# Patient Record
Sex: Male | Born: 2000 | Race: Black or African American | Hispanic: No | Marital: Single | Smoking: Never smoker
Health system: Southern US, Community
[De-identification: ages and names within clinical notes are randomized; demographics above are authoritative.]

## PROBLEM LIST (undated history)

## (undated) DIAGNOSIS — K5900 Constipation, unspecified: Secondary | ICD-10-CM

## (undated) DIAGNOSIS — Z9109 Other allergy status, other than to drugs and biological substances: Secondary | ICD-10-CM

---
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
## 2018-08-15 DIAGNOSIS — Z113 Encounter for screening for infections with a predominantly sexual mode of transmission: Secondary | ICD-10-CM | POA: Diagnosis not present

## 2020-02-05 ENCOUNTER — Encounter (HOSPITAL_COMMUNITY): Payer: Self-pay

## 2020-02-05 ENCOUNTER — Other Ambulatory Visit: Payer: Self-pay

## 2020-02-05 ENCOUNTER — Emergency Department (HOSPITAL_COMMUNITY)
Admission: EM | Admit: 2020-02-05 | Discharge: 2020-02-05 | Disposition: A | Discharge status: Home / Self Care | Payer: BC Managed Care – PPO | Attending: Emergency Medicine | Admitting: Emergency Medicine

## 2020-02-05 DIAGNOSIS — Z91018 Allergy to other foods: Secondary | ICD-10-CM | POA: Insufficient documentation

## 2020-02-05 DIAGNOSIS — T782XXA Anaphylactic shock, unspecified, initial encounter: Secondary | ICD-10-CM | POA: Insufficient documentation

## 2020-02-05 DIAGNOSIS — R109 Unspecified abdominal pain: Secondary | ICD-10-CM | POA: Diagnosis present

## 2020-02-05 MED ORDER — FAMOTIDINE 20 MG PO TABS
20.0000 mg | ORAL_TABLET | Freq: Once | ORAL | Status: AC
  Administered 2020-02-05: 20 mg via ORAL
  Filled 2020-02-05: qty 1

## 2020-02-05 MED ORDER — DEXAMETHASONE 4 MG PO TABS
10.0000 mg | ORAL_TABLET | Freq: Once | ORAL | Status: AC
  Administered 2020-02-05: 07:00:00 10 mg via ORAL
  Filled 2020-02-05: qty 3

## 2020-02-05 MED ORDER — METHYLPREDNISOLONE SODIUM SUCC 125 MG IJ SOLR: 125.0000 mg | Freq: Once | INTRAMUSCULAR | Status: DC

## 2020-02-05 MED ORDER — EPINEPHRINE 0.3 MG/0.3ML IJ SOAJ: 0.3000 mg | INTRAMUSCULAR | 0 refills | Status: AC | PRN

## 2020-02-05 MED ORDER — DEXAMETHASONE SODIUM PHOSPHATE 10 MG/ML IJ SOLN
10.0000 mg | Freq: Once | INTRAMUSCULAR | Status: DC
  Filled 2020-02-05: qty 1

## 2020-02-05 MED ORDER — DEXAMETHASONE 4 MG PO TABS
10.0000 mg | ORAL_TABLET | Freq: Once | ORAL | Status: DC
  Filled 2020-02-05: qty 3

## 2020-02-05 MED ORDER — FAMOTIDINE IN NACL 20-0.9 MG/50ML-% IV SOLN: 20.0000 mg | Freq: Once | INTRAVENOUS | Status: DC

## 2020-02-05 NOTE — ED Provider Notes (Signed)
[facility] EMERGENCY DEPARTMENT Provider Note   CSN: [id number] Arrival date & time: 02/05/20  0531     History Chief Complaint  Patient presents with  . Allergic Reaction    Willie Carlson is a 19 y.o. male.  HPI     This is a 19 year old male with a known allergy to tree nuts who presents with anaphylaxis.  Patient reports that he ate part of his mother's vegan burger.  They did not realize that had nuts in it.  He started to have abdominal pain and vomiting.  He also noted his eyes were itchy and he looked in the mirror and noted swelling.  His EpiPen at home was expired.  They called EMS.  He received epinephrine and Benadryl with defervesced since of his symptoms.  He reports that he was itchy all over as well but without rash.  Currently he is symptom-free.  Denies shortness of breath, trouble swallowing  Past Medical History:  Diagnosis Date  . Constipation   . Environmental allergies     Patient Active Problem List   Diagnosis Date Noted  . Allergic rhinitis 06/06/2014  . 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 Acne 10/15/2013    No past surgical history on file.     No family history on file.  Social History   Tobacco Use  . Smoking status: Never Smoker  . Smokeless tobacco: Never Used  Substance Use Topics  . Alcohol use: Not on file  . Drug use: Not on file    Home Medications Prior to Admission medications   Medication Sig Start Date End Date Taking? Authorizing Provider  tretinoin (RETIN-A) 0.025 % cream Apply topically at bedtime. Use small amount on entire affected area every other night for 2 weeks, then every night.  Use moisturizer over medication and as often as needed. 10/15/13   Prose, [place] Bing, MD  tretinoin (RETIN-A) 0.05 % cream Apply topically at bedtime. 12/17/13   Tilman Neat, MD    Allergies    Patient has no known allergies.  Review of Systems   Review of Systems  Constitutional: Negative for fever.  HENT:  Positive for facial swelling. Negative for trouble swallowing.   Respiratory: Negative for shortness of breath.   Cardiovascular: Negative for chest pain.  Gastrointestinal: Positive for nausea and vomiting. Negative for abdominal pain.  Skin: Negative for rash.       Itching  All other systems reviewed and are negative.   Physical Exam Updated Vital Signs BP 122/81 (BP Location: Right Arm)   Pulse 83   Temp 97.7 F (36.5 C) (Oral)   Resp 16   Ht 1.778 m (5\' 10" )   Wt 63.5 kg   SpO2 100%   BMI 20.09 kg/m   Physical Exam Vitals and nursing note reviewed.  Constitutional:      Appearance: He is well-developed.     Comments: ABCs intact, nontoxic-appearing  HENT:     Head: Normocephalic and atraumatic.     Mouth/Throat:     Mouth: Mucous membranes are moist.     Comments: No oropharyngeal edema noted Eyes:     Pupils: Pupils are equal, round, and reactive to light.  Cardiovascular:     Rate and Rhythm: Normal rate and regular rhythm.     Heart sounds: Normal heart sounds. No murmur.  Pulmonary:     Effort: Pulmonary effort is normal. No respiratory distress.     Breath sounds: Normal breath sounds. No wheezing.  Abdominal:     General: Bowel sounds are normal.     Palpations: Abdomen is soft.     Tenderness: There is no abdominal tenderness. There is no rebound.  Musculoskeletal:     Cervical back: Neck supple.     Right lower leg: No edema.     Left lower leg: No edema.  Lymphadenopathy:     Cervical: No cervical adenopathy.  Skin:    General: Skin is warm and dry.  Neurological:     Mental Status: He is alert and oriented to person, place, and time.  Psychiatric:        Mood and Affect: Mood normal.     ED Results / Procedures / Treatments   Labs (all labs ordered are listed, but only abnormal results are displayed) Labs Reviewed - No data to display  EKG None  Radiology No results found.  Procedures Procedures (including critical care  time)  CRITICAL CARE Performed by: Merryl Hacker   Total critical care time: 31 minutes  Critical care time was exclusive of separately billable procedures and treating other patients.  Critical care was necessary to treat or prevent imminent or life-threatening deterioration.  Critical care was time spent personally by me on the following activities: development of treatment plan with patient and/or surrogate as well as nursing, discussions with consultants, evaluation of patient's response to treatment, examination of patient, obtaining history from patient or surrogate, ordering and performing treatments and interventions, ordering and review of laboratory studies, ordering and review of radiographic studies, pulse oximetry and re-evaluation of patient's condition.   Medications Ordered in ED Medications  methylPREDNISolone sodium succinate (SOLU-MEDROL) 125 mg/2 mL injection 125 mg (has no administration in time range)  famotidine (PEPCID) IVPB 20 mg premix (has no administration in time range)    ED Course  I have reviewed the triage vital signs and the nursing notes.  Pertinent labs & imaging results that were available during my care of the patient were reviewed by me and considered in my medical decision making (see chart for details).    MDM Rules/Calculators/A&P                       Patient presents following anaphylaxis.  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  Patient was given epinephrine and Benadryl in route with defervescence of his symptoms.  He was subsequently given IV Solu-Medrol and Pepcid here.  He will need to be observed for 4 hours post epinephrine for recurrence of symptoms.  He will be discharged home with a course of steroids, epinephrine, Benadryl as needed.  Final Clinical Impression(s) / ED Diagnoses Final diagnoses:  Anaphylaxis, initial encounter    Rx / DC Orders ED Discharge Orders    None       Merryl Hacker, MD 02/05/20  [phone]

## 2020-02-05 NOTE — ED Triage Notes (Signed)
Patient arrived from home [facility]. Patient ate a burger last night and had an allergic reaction to nuts. Didn't use at home epi. C/O abd pain, vommitted, hives face swollen, itchy. EMS gave 0.5 epi & 50 IM benadryl. VS 126/84, 74 HR, 99% RA, RR 16.

## 2020-02-05 NOTE — ED Provider Notes (Signed)
Care transferred to me. Patient has remained well and asymptomatic. D/c with Rx for epipen. Already received decadron.   Pricilla Loveless, MD 02/05/20 [phone]

## 2021-11-03 ENCOUNTER — Other Ambulatory Visit: Payer: Self-pay

## 2021-11-03 ENCOUNTER — Ambulatory Visit: Payer: Self-pay | Admitting: Internal Medicine

## 2021-11-03 ENCOUNTER — Ambulatory Visit (INDEPENDENT_AMBULATORY_CARE_PROVIDER_SITE_OTHER): Payer: 59 | Admitting: Internal Medicine

## 2021-11-03 ENCOUNTER — Encounter: Payer: Self-pay | Admitting: Internal Medicine

## 2021-11-03 VITALS — BP 114/60 | HR 61 | Temp 97.7°F | Ht 68.6 in | Wt 141.4 lb

## 2021-11-03 DIAGNOSIS — Z0000 Encounter for general adult medical examination without abnormal findings: Secondary | ICD-10-CM | POA: Diagnosis not present

## 2021-11-03 DIAGNOSIS — B356 Tinea cruris: Secondary | ICD-10-CM

## 2021-11-03 NOTE — Patient Instructions (Addendum)
Jock Itch Jock itch is an itchy rash in the groin and upper thigh area. It is a skin infection that is caused by a type of germ (fungus). The rash usually goes away in 2-3 weeks with treatment. What are the causes? This condition may be caused by: Touching a fungal infection elsewhere on your body, such as athlete's foot, and then touching your groin area. Sharing towels or clothing, such as socks or shoes, with someone who has a fungal infection. What increases the risk? This condition is most common in men and adolescent boys. You are also more likely to develop the condition if you: Are in a hot, humid climate. Wear tight-fitting clothes or wet bathing suits for a long time. Play sports. Are overweight. Have diabetes. Have a weakened body defense system (immune system). Sweat a lot. What are the signs or symptoms? Symptoms of this condition include: A red, pink, or brown rash in the groin area. There may be blisters. The rash may spread to your thighs, the opening between the butt (anus), and the butt. Dry and scaly skin on or around the rash. Itchiness. How is this treated? Treatment for this condition may include: Medicine to kill the fungus (antifungal). This may be one of the following: Skin cream. Ointment. Powder. Medicine that you take by mouth. Skin cream or ointment to reduce itching. Lifestyle changes, such as wearing looser clothing and caring for your skin. Follow these instructions at home: Skin care Use skin creams, ointments, or powders exactly as told by your doctor. Wear clothes that are loose. Clothes should not rub against your groin area. Men should wear boxer shorts or loose underwear. Keep your groin area clean and dry. Change your underwear every day. Change out of wet bathing suits as soon as you can. After bathing, use a different towel to dry your groin area. Dry the area gently and completely. Avoid hot baths and showers. Hot water can make itching  worse. Do not scratch the area. General instructions Take and apply over-the-counter and prescription medicines only as told by your doctor. Do not share towels or clothing with other people. Wash your hands often with soap and water for at least 20 seconds, especially after touching your groin area. If you cannot use soap and water, use hand sanitizer. When at the gym: Always wear shoes, especially in the shower and around the swimming pool. Keep any cuts covered. Clean any mats or equipment before using them. Shower as soon as you are done working out. Keep all follow-up visits. Contact a doctor if: Your rash: Gets worse. Does not get better after 2 weeks of treatment. Spreads. Comes back after treatment is done. You have a fever. You have new or more redness, swelling, or pain around your rash. You have fluid, blood, or pus coming from your rash. Summary Jock itch is an itchy rash. It affects the groin and upper thigh area. Jock itch usually goes away in 2-3 weeks with treatment. Keep your groin area clean and dry. This information is not intended to replace advice given to you by your health care provider. Make sure you discuss any questions you have with your health care provider. Document Revised: 02/10/2021 Document Reviewed: 02/10/2021 Elsevier Patient Education  2022 Else[employer] Inc.   Health Maintenance, Male Adopting a healthy lifestyle and getting preventive care are important in promoting health and wellness. Ask your health care provider about: The right schedule for you to have regular tests and exams. Things you can  do on your own to prevent diseases and keep yourself healthy. What should I know about diet, weight, and exercise? Eat a healthy diet  Eat a diet that includes plenty of vegetables, fruits, low-fat dairy products, and lean protein. Do not eat a lot of foods that are high in solid fats, added sugars, or sodium. Maintain a healthy weight Body mass index  (BMI) is a measurement that can be used to identify possible weight problems. It estimates body fat based on height and weight. Your health care provider can help determine your BMI and help you achieve or maintain a healthy weight. Get regular exercise Get regular exercise. This is one of the most important things you can do for your health. Most adults should: Exercise for at least 150 minutes each week. The exercise should increase your heart rate and make you sweat (moderate-intensity exercise). Do strengthening exercises at least twice a week. This is in addition to the moderate-intensity exercise. Spend less time sitting. Even light physical activity can be beneficial. Watch cholesterol and blood lipids Have your blood tested for lipids and cholesterol at 22 years of age, then have this test every 5 years. You may need to have your cholesterol levels checked more often if: Your lipid or cholesterol levels are high. You are older than 22 years of age. You are at high risk for heart disease. What should I know about cancer screening? Many types of cancers can be detected early and may often be prevented. Depending on your health history and family history, you may need to have cancer screening at various ages. This may include screening for: Colorectal cancer. Prostate cancer. Skin cancer. Lung cancer. What should I know about heart disease, diabetes, and high blood pressure? Blood pressure and heart disease High blood pressure causes heart disease and increases the risk of stroke. This is more likely to develop in people who have high blood pressure readings or are overweight. Talk with your health care provider about your target blood pressure readings. Have your blood pressure checked: Every 3-5 years if you are 51-73 years of age. Every year if you are 77 years old or older. If you are between the ages of 52 and 12 and are a current or former smoker, ask your health care provider if  you should have a one-time screening for abdominal aortic aneurysm (AAA). Diabetes Have regular diabetes screenings. This checks your fasting blood sugar level. Have the screening done: Once every three years after age 27 if you are at a normal weight and have a low risk for diabetes. More often and at a younger age if you are overweight or have a high risk for diabetes. What should I know about preventing infection? Hepatitis B If you have a higher risk for hepatitis B, you should be screened for this virus. Talk with your health care provider to find out if you are at risk for hepatitis B infection. Hepatitis C Blood testing is recommended for: Everyone born from [birth year] through 1965. Anyone with known risk factors for hepatitis C. Sexually transmitted infections (STIs) You should be screened each year for STIs, including gonorrhea and chlamydia, if: You are sexually active and are younger than 22 years of age. You are older than 22 years of age and your health care provider tells you that you are at risk for this type of infection. Your sexual activity has changed since you were last screened, and you are at increased risk for chlamydia or gonorrhea. Ask your  health care provider if you are at risk. Ask your health care provider about whether you are at high risk for HIV. Your health care provider may recommend a prescription medicine to help prevent HIV infection. If you choose to take medicine to prevent HIV, you should first get tested for HIV. You should then be tested every 3 months for as long as you are taking the medicine. Follow these instructions at home: Alcohol use Do not drink alcohol if your health care provider tells you not to drink. If you drink alcohol: Limit how much you have to 0-2 drinks a day. Know how much alcohol is in your drink. In the [place] one drink equals one 12 oz bottle of beer (355 mL), one 5 oz glass of wine (148 mL), or one 1 oz glass of hard liquor (44  mL). Lifestyle Do not use any products that contain nicotine or tobacco. These products include cigarettes, chewing tobacco, and vaping devices, such as e-cigarettes. If you need help quitting, ask your health care provider. Do not use street drugs. Do not share needles. Ask your health care provider for help if you need support or information about quitting drugs. General instructions Schedule regular health, dental, and eye exams. Stay current with your vaccines. Tell your health care provider if: You often feel depressed. You have ever been abused or do not feel safe at home. Summary Adopting a healthy lifestyle and getting preventive care are important in promoting health and wellness. Follow your health care provider's instructions about healthy diet, exercising, and getting tested or screened for diseases. Follow your health care provider's instructions on monitoring your cholesterol and blood pressure. This information is not intended to replace advice given to you by your health care provider. Make sure you discuss any questions you have with your health care provider. Document Revised: 04/13/2021 Document Reviewed: 04/13/2021 Elsevier Patient Education  2022 [employer].

## 2021-11-03 NOTE — Progress Notes (Signed)
I,Willie Carlson,acting as a Neurosurgeon for Willie Aliment, MD.,have documented all relevant documentation on the behalf of Willie Aliment, MD,as directed by  Willie Aliment, MD while in the presence of Willie Aliment, MD.  This visit occurred during the SARS-CoV-2 public health emergency.  Safety protocols were in place, including screening questions prior to the visit, additional usage of staff PPE, and extensive cleaning of exam room while observing appropriate contact time as indicated for disinfecting solutions.  Subjective:     Patient ID: Willie Carlson , male    DOB: 2001/05/24 , 20 y.o.   MRN: [id number]   Chief Complaint  Patient presents with   Establish Care    HPI  Patient is here to establish care. He is a Development worker, international aid at [employer]. He presents today to establish primary care.  He is a single, no children. He has a Oncologist in [employer] from [place]. He would like to have a physical exam today.   He is against all vaccines.     Past Medical History:  Diagnosis Date   Constipation    Environmental allergies      Family History  Problem Relation Age of Onset   Healthy Mother    Healthy Father    Healthy Sister    Healthy Brother    Arthritis Maternal Grandfather      Current Outpatient Medications:    EPINEPHrine (EPIPEN 2-PAK) 0.3 mg/0.3 mL IJ SOAJ injection, Inject 0.3 mLs (0.3 mg total) into the muscle as needed for anaphylaxis., Disp: 1 each, Rfl: 0   Allergies  Allergen Reactions   Other Nausea And Vomiting and Swelling    Tree Nuts      Review of Systems  Constitutional: Negative.   HENT: Negative.    Eyes: Negative.   Respiratory: Negative.    Cardiovascular: Negative.   Endocrine: Negative.   Genitourinary: Negative.   Musculoskeletal: Negative.   Skin:  Positive for rash.        He would like to discuss some skin issues. He states that he had a rash on his groin area but it has healed. He states he  first noticed it about a month ago.  He states he was treated at [facility]. States bloodwork was negative for STI. He was prescribed a "cream" that helped to clear it up.   He also had bites on his legs. He had his dorm room checked for bed bugs. The bites are healing.  Allergic/Immunologic: Negative.   Neurological: Negative.   Hematological: Negative.   Psychiatric/Behavioral: Negative.      Today's Vitals   11/03/21 0929  BP: 114/60  Pulse: 61  Temp: 97.7 F (36.5 C)  TempSrc: Oral  Weight: 141 lb 6.4 oz (64.1 kg)  Height: 5' 8.6" (1.742 m)   Body mass index is 21.13 kg/m.  Wt Readings from Last 3 Encounters:  11/03/21 141 lb 6.4 oz (64.1 kg)  02/05/20 140 lb (63.5 kg)  12/03/16 151 lb 10.8 oz (68.8 kg) (58 %, Z= 0.20)*   * Growth percentiles are based on CDC (Boys, 2-20 Years) data.    Objective:  Physical Exam Vitals and nursing note reviewed. Exam conducted with a chaperone present.  Constitutional:      Appearance: Normal appearance.  HENT:     Head: Normocephalic and atraumatic.     Right Ear: Tympanic membrane, ear canal and external ear normal.     Left Ear: Tympanic membrane, ear  canal and external ear normal.     Nose:     Comments: Masked     Mouth/Throat:     Comments: Masked  Eyes:     Extraocular Movements: Extraocular movements intact.     Conjunctiva/sclera: Conjunctivae normal.     Pupils: Pupils are equal, round, and reactive to light.  Cardiovascular:     Rate and Rhythm: Normal rate and regular rhythm.     Pulses: Normal pulses.     Heart sounds: Normal heart sounds.  Pulmonary:     Effort: Pulmonary effort is normal.     Breath sounds: Normal breath sounds.  Chest:  Breasts:    Right: Normal. No swelling, bleeding, inverted nipple, mass or nipple discharge.     Left: Normal. No swelling, bleeding, inverted nipple, mass or nipple discharge.  Abdominal:     General: Abdomen is flat. Bowel sounds are normal.     Palpations:  Abdomen is soft.  Genitourinary:    Pubic Area: No rash.      Penis: Circumcised.      Testes: Normal.     Epididymis:     Right: Normal.     Left: Normal.     Prostate: Normal.     Rectum: Normal. Guaiac result negative.     Comments: He is sweating profusely in genital area Musculoskeletal:        General: Normal range of motion.     Cervical back: Normal range of motion and neck supple.  Skin:    General: Skin is warm.  Neurological:     General: No focal deficit present.     Mental Status: He is alert.  Psychiatric:        Mood and Affect: Mood normal.        Behavior: Behavior normal.        Assessment And Plan:     1. Routine general medical examination at health care facility Comments: A full exam was performed. He does not wish to have any vaccinations. PATIENT IS ADVISED TO GET 30-45 MINUTES REGULAR EXERCISE NO LESS THAN FOUR TO FIVE DAYS PER WEEK - BOTH WEIGHTBEARING EXERCISES AND AEROBIC ARE RECOMMENDED.  PATIENT IS ADVISED TO FOLLOW A HEALTHY DIET WITH AT LEAST SIX FRUITS/VEGGIES PER DAY, DECREASE INTAKE OF RED MEAT, AND TO INCREASE FISH INTAKE TO TWO DAYS PER WEEK.  MEATS/FISH SHOULD NOT BE FRIED, BAKED OR BROILED IS PREFERABLE.  IT IS ALSO IMPORTANT TO CUT BACK ON YOUR SUGAR INTAKE. PLEASE AVOID ANYTHING WITH ADDED SUGAR, CORN SYRUP OR OTHER SWEETENERS. IF YOU MUST USE A SWEETENER, YOU CAN TRY STEVIA. IT IS ALSO IMPORTANT TO AVOID ARTIFICIALLY SWEETENERS AND DIET BEVERAGES. LASTLY, I SUGGEST WEARING SPF 50 SUNSCREEN ON EXPOSED PARTS AND ESPECIALLY WHEN IN THE DIRECT SUNLIGHT FOR AN EXTENDED PERIOD OF TIME.  PLEASE AVOID FAST FOOD RESTAURANTS AND INCREASE YOUR WATER INTAKE.  2. Tinea cruris Comments: Resolving. I will request his records from [facility]. He is encouraged to keep genital area clean/dry. Advised to try GoldBond powder daily.    Patient was given opportunity to ask questions. Patient verbalized understanding of the plan and was able to repeat key  elements of the plan. All questions were answered to their satisfaction.   I, Willie Aliment, MD, have reviewed all documentation for this visit. The documentation on 11/03/21 for the exam, diagnosis, procedures, and orders are all accurate and complete.   IF YOU HAVE BEEN REFERRED TO A SPECIALIST, IT MAY TAKE 1-2  WEEKS TO SCHEDULE/PROCESS THE REFERRAL. IF YOU HAVE NOT HEARD FROM US/SPECIALIST IN TWO WEEKS, PLEASE GIVE [place] A CALL AT [phone].   THE PATIENT IS ENCOURAGED TO PRACTICE SOCIAL DISTANCING DUE TO THE COVID-19 PANDEMIC.

## 2021-11-04 LAB — CBC
Hematocrit: 45 % (ref 37.5–51.0)
Hemoglobin: 14.8 g/dL (ref 13.0–17.7)
MCH: 27.1 pg (ref 26.6–33.0)
MCHC: 32.9 g/dL (ref 31.5–35.7)
MCV: 82 fL (ref 79–97)
Platelets: 173 10*3/uL (ref 150–450)
RBC: 5.47 x10E6/uL (ref 4.14–5.80)
RDW: 12.8 % (ref 11.6–15.4)
WBC: 2.9 10*3/uL — ABNORMAL LOW (ref 3.4–10.8)

## 2021-11-04 LAB — CMP14+EGFR
ALT: 19 IU/L (ref 0–44)
AST: 29 IU/L (ref 0–40)
Albumin/Globulin Ratio: 1.9 (ref 1.2–2.2)
Albumin: 5.2 g/dL (ref 4.1–5.2)
Alkaline Phosphatase: 82 IU/L (ref 44–121)
BUN/Creatinine Ratio: 12 (ref 9–20)
BUN: 11 mg/dL (ref 6–20)
Bilirubin Total: 1.4 mg/dL — ABNORMAL HIGH (ref 0.0–1.2)
CO2: 24 mmol/L (ref 20–29)
Calcium: 9.8 mg/dL (ref 8.7–10.2)
Chloride: 109 mmol/L — ABNORMAL HIGH (ref 96–106)
Creatinine, Ser: 0.89 mg/dL (ref 0.76–1.27)
Globulin, Total: 2.7 g/dL (ref 1.5–4.5)
Glucose: 83 mg/dL (ref 70–99)
Potassium: 4.6 mmol/L (ref 3.5–5.2)
Sodium: 147 mmol/L — ABNORMAL HIGH (ref 134–144)
Total Protein: 7.9 g/dL (ref 6.0–8.5)
eGFR: 124 mL/min/{1.73_m2} (ref >59)

## 2021-11-04 LAB — LIPID PANEL
Chol/HDL Ratio: 2.8 ratio (ref 0.0–5.0)
Cholesterol, Total: 141 mg/dL (ref 100–199)
HDL: 50 mg/dL (ref >39)
LDL Chol Calc (NIH): 79 mg/dL (ref 0–99)
Triglycerides: 59 mg/dL (ref 0–149)
VLDL Cholesterol Cal: 12 mg/dL (ref 5–40)

## 2021-11-04 LAB — HEPATITIS C ANTIBODY: Hep C Virus Ab: <0.1 s/co ratio (ref 0.0–0.9)

## 2021-11-12 ENCOUNTER — Ambulatory Visit: Payer: Self-pay | Admitting: Nurse Practitioner

## 2022-02-10 ENCOUNTER — Other Ambulatory Visit: Payer: Self-pay

## 2022-02-10 ENCOUNTER — Other Ambulatory Visit: Payer: BC Managed Care – PPO

## 2022-02-10 DIAGNOSIS — D72819 Decreased white blood cell count, unspecified: Secondary | ICD-10-CM

## 2022-02-11 LAB — CBC
Hematocrit: 46.8 % (ref 37.5–51.0)
Hemoglobin: 14.8 g/dL (ref 13.0–17.7)
MCH: 26.8 pg (ref 26.6–33.0)
MCHC: 31.6 g/dL (ref 31.5–35.7)
MCV: 85 fL (ref 79–97)
Platelets: 180 10*3/uL (ref 150–450)
RBC: 5.53 x10E6/uL (ref 4.14–5.80)
RDW: 13.5 % (ref 11.6–15.4)
WBC: 2.7 10*3/uL — ABNORMAL LOW (ref 3.4–10.8)

## 2022-11-24 ENCOUNTER — Ambulatory Visit (INDEPENDENT_AMBULATORY_CARE_PROVIDER_SITE_OTHER): Payer: Managed Care, Other (non HMO) | Admitting: Internal Medicine

## 2022-11-24 ENCOUNTER — Encounter: Payer: Self-pay | Admitting: Internal Medicine

## 2022-11-24 VITALS — BP 112/80 | HR 63 | Temp 98.1°F | Ht 70.0 in | Wt 140.8 lb

## 2022-11-24 DIAGNOSIS — Z6820 Body mass index (BMI) 20.0-20.9, adult: Secondary | ICD-10-CM | POA: Diagnosis not present

## 2022-11-24 DIAGNOSIS — Z5320 Procedure and treatment not carried out because of patient's decision for unspecified reasons: Secondary | ICD-10-CM | POA: Diagnosis not present

## 2022-11-24 DIAGNOSIS — Z0000 Encounter for general adult medical examination without abnormal findings: Secondary | ICD-10-CM | POA: Diagnosis not present

## 2022-11-24 DIAGNOSIS — Z2821 Immunization not carried out because of patient refusal: Secondary | ICD-10-CM

## 2022-11-24 NOTE — Patient Instructions (Signed)
Health Maintenance, Male Adopting a healthy lifestyle and getting preventive care are important in promoting health and wellness. Ask your health care provider about: The right schedule for you to have regular tests and exams. Things you can do on your own to prevent diseases and keep yourself healthy. What should I know about diet, weight, and exercise? Eat a healthy diet  Eat a diet that includes plenty of vegetables, fruits, low-fat dairy products, and lean protein. Do not eat a lot of foods that are high in solid fats, added sugars, or sodium. Maintain a healthy weight Body mass index (BMI) is a measurement that can be used to identify possible weight problems. It estimates body fat based on height and weight. Your health care provider can help determine your BMI and help you achieve or maintain a healthy weight. Get regular exercise Get regular exercise. This is one of the most important things you can do for your health. Most adults should: Exercise for at least 150 minutes each week. The exercise should increase your heart rate and make you sweat (moderate-intensity exercise). Do strengthening exercises at least twice a week. This is in addition to the moderate-intensity exercise. Spend less time sitting. Even light physical activity can be beneficial. Watch cholesterol and blood lipids Have your blood tested for lipids and cholesterol at 23 years of age, then have this test every 5 years. You may need to have your cholesterol levels checked more often if: Your lipid or cholesterol levels are high. You are older than 23 years of age. You are at high risk for heart disease. What should I know about cancer screening? Many types of cancers can be detected early and may often be prevented. Depending on your health history and family history, you may need to have cancer screening at various ages. This may include screening for: Colorectal cancer. Prostate cancer. Skin cancer. Lung  cancer. What should I know about heart disease, diabetes, and high blood pressure? Blood pressure and heart disease High blood pressure causes heart disease and increases the risk of stroke. This is more likely to develop in people who have high blood pressure readings or are overweight. Talk with your health care provider about your target blood pressure readings. Have your blood pressure checked: Every 3-5 years if you are 18-39 years of age. Every year if you are 40 years old or older. If you are between the ages of 65 and 75 and are a current or former smoker, ask your health care provider if you should have a one-time screening for abdominal aortic aneurysm (AAA). Diabetes Have regular diabetes screenings. This checks your fasting blood sugar level. Have the screening done: Once every three years after age 45 if you are at a normal weight and have a low risk for diabetes. More often and at a younger age if you are overweight or have a high risk for diabetes. What should I know about preventing infection? Hepatitis B If you have a higher risk for hepatitis B, you should be screened for this virus. Talk with your health care provider to find out if you are at risk for hepatitis B infection. Hepatitis C Blood testing is recommended for: Everyone born from 1945 through 1965. Anyone with known risk factors for hepatitis C. Sexually transmitted infections (STIs) You should be screened each year for STIs, including gonorrhea and chlamydia, if: You are sexually active and are younger than 24 years of age. You are older than 24 years of age and your   health care provider tells you that you are at risk for this type of infection. Your sexual activity has changed since you were last screened, and you are at increased risk for chlamydia or gonorrhea. Ask your health care provider if you are at risk. Ask your health care provider about whether you are at high risk for HIV. Your health care provider  may recommend a prescription medicine to help prevent HIV infection. If you choose to take medicine to prevent HIV, you should first get tested for HIV. You should then be tested every 3 months for as long as you are taking the medicine. Follow these instructions at home: Alcohol use Do not drink alcohol if your health care provider tells you not to drink. If you drink alcohol: Limit how much you have to 0-2 drinks a day. Know how much alcohol is in your drink. In the [place] one drink equals one 12 oz bottle of beer (355 mL), one 5 oz glass of wine (148 mL), or one 1 oz glass of hard liquor (44 mL). Lifestyle Do not use any products that contain nicotine or tobacco. These products include cigarettes, chewing tobacco, and vaping devices, such as e-cigarettes. If you need help quitting, ask your health care provider. Do not use street drugs. Do not share needles. Ask your health care provider for help if you need support or information about quitting drugs. General instructions Schedule regular health, dental, and eye exams. Stay current with your vaccines. Tell your health care provider if: You often feel depressed. You have ever been abused or do not feel safe at home. Summary Adopting a healthy lifestyle and getting preventive care are important in promoting health and wellness. Follow your health care provider's instructions about healthy diet, exercising, and getting tested or screened for diseases. Follow your health care provider's instructions on monitoring your cholesterol and blood pressure. This information is not intended to replace advice given to you by your health care provider. Make sure you discuss any questions you have with your health care provider. Document Revised: 04/13/2021 Document Reviewed: 04/13/2021 Elsevier Patient Education  2023 Elsevier Inc.  

## 2022-11-24 NOTE — Progress Notes (Signed)
Rich Brave Llittleton,acting as a Education administrator for Maximino Greenland, MD.,have documented all relevant documentation on the behalf of Maximino Greenland, MD,as directed by  Maximino Greenland, MD while in the presence of Maximino Greenland, MD.   Subjective:     Patient ID: Willie Carlson , male    DOB: 01-18-2001 , 21 y.o.   MRN: [id number]   Chief Complaint  Patient presents with   Annual Exam    HPI  Patient presents today for a physical. He denies having headaches, chest pain and shortness of breath. He states he exercises 3-4 days per week. He is not currently sexually active.      Past Medical History:  Diagnosis Date   Constipation    Environmental allergies      Family History  Problem Relation Age of Onset   Healthy Mother    Healthy Father    Healthy Sister    Healthy Brother    Arthritis Maternal Grandfather      Current Outpatient Medications:    EPINEPHrine (EPIPEN 2-PAK) 0.3 mg/0.3 mL IJ SOAJ injection, Inject 0.3 mLs (0.3 mg total) into the muscle as needed for anaphylaxis., Disp: 1 each, Rfl: 0   Allergies  Allergen Reactions   Dust Mite Extract    Molds & Smuts    Other Nausea And Vomiting and Swelling    Tree Nuts      Men's preventive visit. Patient Health Questionnaire (PHQ-2) is  [place] Office Visit from 11/24/2022 in [facility]  PHQ-2 Total Score 0     . Patient is on a healthy diet. Marital status: Single. Relevant history for alcohol use is:  Social History   Substance and Sexual Activity  Alcohol Use Not Currently  . Relevant history for tobacco use is:  Social History   Tobacco Use  Smoking Status Never  Smokeless Tobacco Never  .   Review of Systems  Constitutional: Negative.   HENT: Negative.    Eyes: Negative.   Respiratory: Negative.    Cardiovascular: Negative.   Gastrointestinal: Negative.   Endocrine: Negative.   Genitourinary: Negative.   Musculoskeletal: Negative.   Skin: Negative.    Neurological: Negative.   Hematological: Negative.   Psychiatric/Behavioral: Negative.       Today's Vitals   11/24/22 1115  BP: 112/80  Pulse: 63  Temp: 98.1 F (36.7 C)  Weight: 140 lb 12.8 oz (63.9 kg)  Height: _0  (1.778 m)  PainSc: 0-No pain   Body mass index is 20.2 kg/m.  Wt Readings from Last 3 Encounters:  11/24/22 140 lb 12.8 oz (63.9 kg)  11/03/21 141 lb 6.4 oz (64.1 kg)  02/05/20 140 lb (63.5 kg)     Objective:  Physical Exam Vitals and nursing note reviewed.  Constitutional:      Appearance: Normal appearance.  HENT:     Head: Normocephalic and atraumatic.     Right Ear: Tympanic membrane, ear canal and external ear normal.     Left Ear: Tympanic membrane, ear canal and external ear normal.     Nose: Nose normal.     Comments: Piercing     Mouth/Throat:     Mouth: Mucous membranes are moist.     Pharynx: Oropharynx is clear.  Eyes:     Extraocular Movements: Extraocular movements intact.     Conjunctiva/sclera: Conjunctivae normal.     Pupils: Pupils are equal, round, and reactive to light.  Cardiovascular:     Rate and Rhythm:  Normal rate and regular rhythm.     Pulses: Normal pulses.     Heart sounds: Normal heart sounds.  Pulmonary:     Effort: Pulmonary effort is normal.     Breath sounds: Normal breath sounds.  Chest:  Breasts:    Right: Normal. No swelling, bleeding, inverted nipple, mass or nipple discharge.     Left: Normal. No swelling, bleeding, inverted nipple, mass or nipple discharge.  Abdominal:     General: Abdomen is flat. Bowel sounds are normal.     Palpations: Abdomen is soft.  Genitourinary:    Comments: Declined by pt Musculoskeletal:        General: Normal range of motion.     Cervical back: Normal range of motion and neck supple.  Skin:    General: Skin is warm.     Comments: Irregularly shaped hyperpigmented nevus medial left knee  Neurological:     General: No focal deficit present.     Mental Status: He is  alert.  Psychiatric:        Mood and Affect: Mood normal.        Behavior: Behavior normal.       Assessment And Plan:    1. Encounter for general adult medical examination w/o abnormal findings Comments: A full exam was performed, encouraged to perform self testicular exams. Declines STD testing.  PATIENT IS ADVISED TO GET 30-45 MINUTES REGULAR EXERCISE NO LESS THAN FOUR TO FIVE DAYS PER WEEK - BOTH WEIGHTBEARING EXERCISES AND AEROBIC ARE RECOMMENDED.  PATIENT IS ADVISED TO FOLLOW A HEALTHY DIET WITH AT LEAST SIX FRUITS/VEGGIES PER DAY, DECREASE INTAKE OF RED MEAT, AND TO INCREASE FISH INTAKE TO TWO DAYS PER WEEK.  MEATS/FISH SHOULD NOT BE FRIED, BAKED OR BROILED IS PREFERABLE.  IT IS ALSO IMPORTANT TO CUT BACK ON YOUR SUGAR INTAKE. PLEASE AVOID ANYTHING WITH ADDED SUGAR, CORN SYRUP OR OTHER SWEETENERS. IF YOU MUST USE A SWEETENER, YOU CAN TRY STEVIA. IT IS ALSO IMPORTANT TO AVOID ARTIFICIALLY SWEETENERS AND DIET BEVERAGES. LASTLY, I SUGGEST WEARING SPF 50 SUNSCREEN ON EXPOSED PARTS AND ESPECIALLY WHEN IN THE DIRECT SUNLIGHT FOR AN EXTENDED PERIOD OF TIME.  PLEASE AVOID FAST FOOD RESTAURANTS AND INCREASE YOUR WATER INTAKE. - CBC no Diff - CMP14+EGFR  2. BMI 20.0-20.9, adult Comments: He is encouraged to aim for at least 150 minutes of exercise per week.  3. HIV screening declined  4. Influenza vaccination declined  Patient was given opportunity to ask questions. Patient verbalized understanding of the plan and was able to repeat key elements of the plan. All questions were answered to their satisfaction.   I, Maximino Greenland, MD, have reviewed all documentation for this visit. The documentation on 11/24/22 for the exam, diagnosis, procedures, and orders are all accurate and complete.   THE PATIENT IS ENCOURAGED TO PRACTICE SOCIAL DISTANCING DUE TO THE COVID-19 PANDEMIC.

## 2022-11-25 LAB — CMP14+EGFR
ALT: 50 IU/L — ABNORMAL HIGH (ref 0–44)
AST: 54 IU/L — ABNORMAL HIGH (ref 0–40)
Albumin/Globulin Ratio: 1.9 (ref 1.2–2.2)
Albumin: 5 g/dL (ref 4.3–5.2)
Alkaline Phosphatase: 93 IU/L (ref 44–121)
BUN/Creatinine Ratio: 6 — ABNORMAL LOW (ref 9–20)
BUN: 6 mg/dL (ref 6–20)
Bilirubin Total: 1.7 mg/dL — ABNORMAL HIGH (ref 0.0–1.2)
CO2: 25 mmol/L (ref 20–29)
Calcium: 9.8 mg/dL (ref 8.7–10.2)
Chloride: 104 mmol/L (ref 96–106)
Creatinine, Ser: 0.97 mg/dL (ref 0.76–1.27)
Globulin, Total: 2.6 g/dL (ref 1.5–4.5)
Glucose: 81 mg/dL (ref 70–99)
Potassium: 4.1 mmol/L (ref 3.5–5.2)
Sodium: 141 mmol/L (ref 134–144)
Total Protein: 7.6 g/dL (ref 6.0–8.5)
eGFR: 112 mL/min/{1.73_m2} (ref >59)

## 2022-11-25 LAB — CBC
Hematocrit: 46.5 % (ref 37.5–51.0)
Hemoglobin: 14.8 g/dL (ref 13.0–17.7)
MCH: 27.3 pg (ref 26.6–33.0)
MCHC: 31.8 g/dL (ref 31.5–35.7)
MCV: 86 fL (ref 79–97)
Platelets: 183 10*3/uL (ref 150–450)
RBC: 5.42 x10E6/uL (ref 4.14–5.80)
RDW: 13.2 % (ref 11.6–15.4)
WBC: 2.6 10*3/uL — ABNORMAL LOW (ref 3.4–10.8)

## 2023-01-06 ENCOUNTER — Other Ambulatory Visit: Payer: Managed Care, Other (non HMO)

## 2023-01-06 DIAGNOSIS — R7989 Other specified abnormal findings of blood chemistry: Secondary | ICD-10-CM

## 2023-01-07 LAB — HEPATIC FUNCTION PANEL
ALT: 18 IU/L (ref 0–44)
AST: 26 IU/L (ref 0–40)
Albumin: 4.7 g/dL (ref 4.3–5.2)
Alkaline Phosphatase: 81 IU/L (ref 44–121)
Bilirubin Total: 1.9 mg/dL — ABNORMAL HIGH (ref 0.0–1.2)
Bilirubin, Direct: 0.29 mg/dL (ref 0.00–0.40)
Total Protein: 6.7 g/dL (ref 6.0–8.5)

## 2023-12-13 ENCOUNTER — Encounter: Payer: Self-pay | Admitting: Internal Medicine

## 2023-12-14 ENCOUNTER — Encounter: Payer: Self-pay | Admitting: Internal Medicine

## 2023-12-14 NOTE — Progress Notes (Deleted)
 I,Macy Polio T Emmitt, CMA,acting as a neurosurgeon for Catheryn LOISE Slocumb, MD.,have documented all relevant documentation on the behalf of Catheryn LOISE Slocumb, MD,as directed by  Catheryn LOISE Slocumb, MD while in the presence of Catheryn LOISE Slocumb, MD.  Subjective:   Patient ID: Willie Carlson , male    DOB: [birth year] , 23 y.o.   MRN: [id number]  No chief complaint on file.   HPI  HPI   Past Medical History:  Diagnosis Date   Constipation    Environmental allergies      Family History  Problem Relation Age of Onset   Healthy Mother    Healthy Father    Healthy Sister    Healthy Brother    Arthritis Maternal Grandfather      Current Outpatient Medications:    EPINEPHrine  (EPIPEN  2-PAK) 0.3 mg/0.3 mL IJ SOAJ injection, Inject 0.3 mLs (0.3 mg total) into the muscle as needed for anaphylaxis., Disp: 1 each, Rfl: 0   Allergies  Allergen Reactions   Dust Mite Extract    Molds & Smuts    Other Nausea And Vomiting and Swelling    Tree Nuts      Men's preventive visit. Patient Health Questionnaire (PHQ-2) is  Flowsheet Row Lab from 01/06/2023 in [facility]  PHQ-2 Total Score 0     . Patient is on a *** diet. Marital status: Single. Relevant history for alcohol use is:  Social History   Substance and Sexual Activity  Alcohol Use Not Currently  . Relevant history for tobacco use is:  Social History   Tobacco Use  Smoking Status Never  Smokeless Tobacco Never  .   Review of Systems   There were no vitals filed for this visit. There is no height or weight on file to calculate BMI.  Wt Readings from Last 3 Encounters:  11/24/22 140 lb 12.8 oz (63.9 kg)  11/03/21 141 lb 6.4 oz (64.1 kg)  02/05/20 140 lb (63.5 kg)    Objective:  Physical Exam      Assessment And Plan:    Encounter for general adult medical examination w/o abnormal findings     No follow-ups on file. Patient was given opportunity to ask questions. Patient verbalized  understanding of the plan and was able to repeat key elements of the plan. All questions were answered to their satisfaction.   Catheryn LOISE Slocumb, MD  I, Catheryn LOISE Slocumb, MD, have reviewed all documentation for this visit. The documentation on 12/14/23 for the exam, diagnosis, procedures, and orders are all accurate and complete.

## 2024-07-11 ENCOUNTER — Encounter: Payer: Self-pay | Admitting: Internal Medicine

## 2024-07-11 ENCOUNTER — Ambulatory Visit: Admitting: Internal Medicine

## 2024-07-11 VITALS — BP 108/72 | HR 84 | Temp 97.9°F | Ht 70.0 in | Wt 149.8 lb

## 2024-07-11 DIAGNOSIS — D72819 Decreased white blood cell count, unspecified: Secondary | ICD-10-CM

## 2024-07-11 DIAGNOSIS — Z0000 Encounter for general adult medical examination without abnormal findings: Secondary | ICD-10-CM

## 2024-07-11 NOTE — Progress Notes (Signed)
 I,Victoria T Emmitt, CMA,acting as a Neurosurgeon for Catheryn LOISE Slocumb, MD.,have documented all relevant documentation on the behalf of Catheryn LOISE Slocumb, MD,as directed by  Catheryn LOISE Slocumb, MD while in the presence of Catheryn LOISE Slocumb, MD.  Subjective:   Patient ID: Willie Carlson , male    DOB: 22-Sep-2001 , 23 y.o.   MRN: [id number]  Chief Complaint  Patient presents with  . Annual Exam    Patient presents today for annual exam.     HPI Discussed the use of AI scribe software for clinical note transcription with the patient, who gave verbal consent to proceed.  History of Present Illness Willie Carlson is a 23 year old male who presents for an annual physical exam.  He has no specific concerns at this time. No chest pain, shortness of breath, skin issues, or bowel movement irregularities. He confirms regular self-testicular exams and is not sexually active.  His medical history includes elevated liver enzymes in 2023, which resolved by follow-up, and a low white blood cell count, monitored annually. He is not currently interested in HIV testing and does not receive flu vaccines.  He takes a multivitamin, zinc, elderberry, and herbal teas as supplements. He has not visited a dermatologist or any other specialists and reports no issues with his skin. He has a mole since birth that has not changed in appearance.  He exercises regularly and drinks plenty of water.    Past Medical History:  Diagnosis Date  . Constipation   . Environmental allergies      Family History  Problem Relation Age of Onset  . Healthy Mother   . Healthy Father   . Healthy Sister   . Healthy Brother   . Arthritis Maternal Grandfather      Current Outpatient Medications:  .  EPINEPHrine  (EPIPEN  2-PAK) 0.3 mg/0.3 mL IJ SOAJ injection, Inject 0.3 mLs (0.3 mg total) into the muscle as needed for anaphylaxis., Disp: 1 each, Rfl: 0   Allergies  Allergen Reactions  . Dust Mite Extract   . Molds & Smuts   . Other  Nausea And Vomiting and Swelling    Tree Nuts      Men's preventive visit. Patient Health Questionnaire (PHQ-2) is  Flowsheet Row Office Visit from 07/11/2024 in [facility]  PHQ-2 Total Score 0  . Patient is on a *** diet. Marital status: Single. Relevant history for alcohol use is:  Social History   Substance and Sexual Activity  Alcohol Use Not Currently  . Relevant history for tobacco use is:  Social History   Tobacco Use  Smoking Status Never  Smokeless Tobacco Never  .   Review of Systems  Constitutional: Negative.   HENT: Negative.    Eyes: Negative.   Respiratory: Negative.    Cardiovascular: Negative.   Gastrointestinal: Negative.   Endocrine: Negative.   Genitourinary: Negative.   Musculoskeletal: Negative.   Skin: Negative.   Allergic/Immunologic: Negative.   Neurological: Negative.   Hematological: Negative.      Today's Vitals   07/11/24 1052  BP: 108/72  Pulse: 84  Temp: 97.9 F (36.6 C)  SpO2: 98%  Weight: 149 lb 12.8 oz (67.9 kg)  Height: 5' 10 (1.778 m)   Body mass index is 21.49 kg/m.  Wt Readings from Last 3 Encounters:  07/11/24 149 lb 12.8 oz (67.9 kg)  11/24/22 140 lb 12.8 oz (63.9 kg)  11/03/21 141 lb 6.4 oz (64.1 kg)    Objective:  Physical Exam Vitals and nursing note reviewed.  Constitutional:      Appearance: Normal appearance.  HENT:     Head: Normocephalic and atraumatic.     Right Ear: Tympanic membrane, ear canal and external ear normal.     Left Ear: Tympanic membrane, ear canal and external ear normal.     Nose: Nose normal.     Comments: Piercing     Mouth/Throat:     Mouth: Mucous membranes are moist.     Pharynx: Oropharynx is clear.  Eyes:     Extraocular Movements: Extraocular movements intact.     Conjunctiva/sclera: Conjunctivae normal.     Pupils: Pupils are equal, round, and reactive to light.  Cardiovascular:     Rate and Rhythm: Normal rate and regular rhythm.      Pulses: Normal pulses.     Heart sounds: Normal heart sounds.  Pulmonary:     Effort: Pulmonary effort is normal.     Breath sounds: Normal breath sounds.  Chest:  Breasts:    Right: Normal. No swelling, bleeding, inverted nipple, mass or nipple discharge.     Left: Normal. No swelling, bleeding, inverted nipple, mass or nipple discharge.  Abdominal:     General: Abdomen is flat. Bowel sounds are normal.     Palpations: Abdomen is soft.  Genitourinary:    Comments: Declined by pt Musculoskeletal:        General: Normal range of motion.     Cervical back: Normal range of motion and neck supple.  Skin:    General: Skin is warm.  Neurological:     General: No focal deficit present.     Mental Status: He is alert.  Psychiatric:        Mood and Affect: Mood normal.        Behavior: Behavior normal.       Assessment And Plan:    Encounter for general adult medical examination w/o abnormal findings   Assessment & Plan Adult Wellness Visit Routine visit with no specific concerns. Declined HIV testing and flu vaccination. Aware of testicular health importance. - Continue regular exercise and hydration. - Encourage continuation of self-testicular exams.  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 - Monitor white blood cell count annually. - Refer to hematologist if white blood cell count falls below 2.  History of elevated liver enzymes, resolved Previous elevation in liver enzymes in 2023, resolved.  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     Return for 1 year HM . Patient was given opportunity to ask questions. Patient verbalized understanding of the plan and was able to repeat key elements of the plan. All questions were answered to their satisfaction.    I, Catheryn LOISE Slocumb, MD, have reviewed all documentation for this visit. The documentation on  07/11/24 for the exam, diagnosis, procedures, and orders are all accurate and complete.

## 2024-07-11 NOTE — Patient Instructions (Signed)
 Health Maintenance, Male  Adopting a healthy lifestyle and getting preventive care are important in promoting health and wellness. Ask your health care provider about:  The right schedule for you to have regular tests and exams.  Things you can do on your own to prevent diseases and keep yourself healthy.  What should I know about diet, weight, and exercise?  Eat a healthy diet    Eat a diet that includes plenty of vegetables, fruits, low-fat dairy products, and lean protein.  Do not eat a lot of foods that are high in solid fats, added sugars, or sodium.  Maintain a healthy weight  Body mass index (BMI) is a measurement that can be used to identify possible weight problems. It estimates body fat based on height and weight. Your health care provider can help determine your BMI and help you achieve or maintain a healthy weight.  Get regular exercise  Get regular exercise. This is one of the most important things you can do for your health. Most adults should:  Exercise for at least 150 minutes each week. The exercise should increase your heart rate and make you sweat (moderate-intensity exercise).  Do strengthening exercises at least twice a week. This is in addition to the moderate-intensity exercise.  Spend less time sitting. Even light physical activity can be beneficial.  Watch cholesterol and blood lipids  Have your blood tested for lipids and cholesterol at 25 years of age, then have this test every 5 years.  You may need to have your cholesterol levels checked more often if:  Your lipid or cholesterol levels are high.  You are older than 25 years of age.  You are at high risk for heart disease.  What should I know about cancer screening?  Many types of cancers can be detected early and may often be prevented. Depending on your health history and family history, you may need to have cancer screening at various ages. This may include screening for:  Colorectal cancer.  Prostate cancer.  Skin cancer.  Lung  cancer.  What should I know about heart disease, diabetes, and high blood pressure?  Blood pressure and heart disease  High blood pressure causes heart disease and increases the risk of stroke. This is more likely to develop in people who have high blood pressure readings or are overweight.  Talk with your health care provider about your target blood pressure readings.  Have your blood pressure checked:  Every 3-5 years if you are 25-95 years of age.  Every year if you are 25 years old or older.  If you are between the ages of 29 and 29 and are a current or former smoker, ask your health care provider if you should have a one-time screening for abdominal aortic aneurysm (AAA).  Diabetes  Have regular diabetes screenings. This checks your fasting blood sugar level. Have the screening done:  Once every three years after age 25 if you are at a normal weight and have a low risk for diabetes.  More often and at a younger age if you are overweight or have a high risk for diabetes.  What should I know about preventing infection?  Hepatitis B  If you have a higher risk for hepatitis B, you should be screened for this virus. Talk with your health care provider to find out if you are at risk for hepatitis B infection.  Hepatitis C  Blood testing is recommended for:  Everyone born from [birth year] through 1967.  Anyone  with known risk factors for hepatitis C.  Sexually transmitted infections (STIs)  You should be screened each year for STIs, including gonorrhea and chlamydia, if:  You are sexually active and are younger than 25 years of age.  You are older than 25 years of age and your health care provider tells you that you are at risk for this type of infection.  Your sexual activity has changed since you were last screened, and you are at increased risk for chlamydia or gonorrhea. Ask your health care provider if you are at risk.  Ask your health care provider about whether you are at high risk for HIV. Your health care provider  may recommend a prescription medicine to help prevent HIV infection. If you choose to take medicine to prevent HIV, you should first get tested for HIV. You should then be tested every 3 months for as long as you are taking the medicine.  Follow these instructions at home:  Alcohol use  Do not drink alcohol if your health care provider tells you not to drink.  If you drink alcohol:  Limit how much you have to 0-2 drinks a day.  Know how much alcohol is in your drink. In the [place] one drink equals one 12 oz bottle of beer (355 mL), one 5 oz glass of wine (148 mL), or one 1 oz glass of hard liquor (44 mL).  Lifestyle  Do not use any products that contain nicotine or tobacco. These products include cigarettes, chewing tobacco, and vaping devices, such as e-cigarettes. If you need help quitting, ask your health care provider.  Do not use street drugs.  Do not share needles.  Ask your health care provider for help if you need support or information about quitting drugs.  General instructions  Schedule regular health, dental, and eye exams.  Stay current with your vaccines.  Tell your health care provider if:  You often feel depressed.  You have ever been abused or do not feel safe at home.  Summary  Adopting a healthy lifestyle and getting preventive care are important in promoting health and wellness.  Follow your health care provider's instructions about healthy diet, exercising, and getting tested or screened for diseases.  Follow your health care provider's instructions on monitoring your cholesterol and blood pressure.  This information is not intended to replace advice given to you by your health care provider. Make sure you discuss any questions you have with your health care provider.  Document Revised: 04/13/2021 Document Reviewed: 04/13/2021  Elsevier Patient Education  2024 [employer].

## 2024-07-12 ENCOUNTER — Encounter: Payer: Self-pay | Admitting: Internal Medicine

## 2024-07-12 DIAGNOSIS — D72819 Decreased white blood cell count, unspecified: Secondary | ICD-10-CM | POA: Insufficient documentation

## 2024-07-12 DIAGNOSIS — Z0000 Encounter for general adult medical examination without abnormal findings: Secondary | ICD-10-CM | POA: Insufficient documentation

## 2024-07-12 NOTE — Assessment & Plan Note (Addendum)
 A full exam was performed.  DRE/pelvic exam deferred.  He is advised to get 30-45 minutes of regular exercise, no less than four to five days per week. Both weight-bearing and aerobic exercises are recommended.  He is advised to follow a healthy diet with at least six fruits/veggies per day, decrease intake of red meat and other saturated fats and to increase fish intake to twice weekly.  Meats/fish should not be fried -- baked, boiled or broiled is preferable. It is also important to cut back on your sugar intake.  Be sure to read labels - try to avoid anything with added sugar, high fructose corn syrup or other sweeteners.  If you must use a sweetener, you can try stevia or monkfruit.  It is also important to avoid artificially sweetened foods/beverages and diet drinks. Lastly, wear SPF 50 sunscreen on exposed skin and when in direct sunlight for an extended period of time.  Be sure to avoid fast food restaurants and aim for at least 60 ounces of water daily.    -Declined HIV testing and flu vaccination. Aware of testicular health . - Continue regular exercise and hydration. - Encourage continuation of self-testicular exams.

## 2024-07-12 NOTE — Assessment & Plan Note (Signed)
 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 He declines having repeat labs performed today. - Monitor white blood cell count annually (he declines today). - Refer to hematologist if white blood cell count falls below 2.

## 2024-08-06 ENCOUNTER — Ambulatory Visit
Admission: EM | Admit: 2024-08-06 | Discharge: 2024-08-06 | Disposition: A | Discharge status: Home / Self Care | Attending: Family Medicine | Admitting: Family Medicine

## 2024-08-06 DIAGNOSIS — Z23 Encounter for immunization: Secondary | ICD-10-CM

## 2024-08-06 DIAGNOSIS — W540XXA Bitten by dog, initial encounter: Secondary | ICD-10-CM

## 2024-08-06 DIAGNOSIS — S61259A Open bite of unspecified finger without damage to nail, initial encounter: Secondary | ICD-10-CM | POA: Diagnosis not present

## 2024-08-06 MED ORDER — AMOXICILLIN-POT CLAVULANATE 875-125 MG PO TABS: 1.0000 | ORAL_TABLET | Freq: Two times a day (BID) | ORAL | 0 refills | Status: AC

## 2024-08-06 MED ORDER — TETANUS-DIPHTH-ACELL PERTUSSIS 5-2.5-18.5 LF-MCG/0.5 IM SUSY
0.5000 mL | PREFILLED_SYRINGE | Freq: Once | INTRAMUSCULAR | Status: AC
  Administered 2024-08-06: 0.5 mL via INTRAMUSCULAR

## 2024-08-06 NOTE — Discharge Instructions (Addendum)
 Keep the wound clean and dry.  Start Augmentin  twice daily for 5 days to help prevent infection.  Your tetanus was updated in the clinic today and is good for 10 years.  Monitor for any signs of infection which include are not limited to redness, drainage, swelling, warmth, fevers or chills and please seek reevaluation in the emergency room if these occur.  Please follow-up with your PCP if your symptoms do not improve.  Hope you feel better soon!

## 2024-08-06 NOTE — ED Triage Notes (Signed)
 Pt present with a dog bite to the rt fourth finger. Pt states he was bitten yesterday and reports his friend is the Therapist, art. Reports the dog is vaccinated

## 2024-08-06 NOTE — ED Provider Notes (Signed)
 [facility]    CSN: [id number] Arrival date & time: 08/06/24  1034      History   Chief Complaint Chief Complaint  Patient presents with   Animal Bite    HPI Willie Carlson is a 23 y.o. male presents for dog bite.  Patient reports yesterday he was bitten by a friend's dog on his right fourth finger.  He cleaned the wound with peroxide and has been applying aloe vera to the area.  He states the dog is up-to-date on its rabies vaccine.  Patient is not up-to-date on his tetanus.  No numbness or tingling to the area.  No other concerns at this time.   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 No pertinent surgical history.     Home Medications    Prior to Admission medications   Medication Sig Start Date End Date Taking? Authorizing Provider  amoxicillin -clavulanate (AUGMENTIN ) 875-125 MG tablet Take 1 tablet by mouth every 12 (twelve) hours for 5 days. 08/06/24 08/11/24 Yes Azhar Yogi, Jodi R, NP  EPINEPHrine  (EPIPEN  2-PAK) 0.3 mg/0.3 mL IJ SOAJ injection Inject 0.3 mLs (0.3 mg total) into the muscle as needed for anaphylaxis. 02/05/20   Freddi Hamilton, MD    Family History Family History  Problem Relation Age of Onset   Healthy Mother    Healthy Father    Healthy Sister    Healthy Brother    Arthritis Maternal Grandfather     Social History Social History   Tobacco Use   Smoking status: Never   Smokeless tobacco: Never  Vaping Use   Vaping status: Never Used  Substance Use Topics   Alcohol use: Not Currently   Drug use: Yes    Types: Marijuana     Allergies   Dust mite extract, Molds & smuts, and Other   Review of  Systems Review of Systems  Skin:  Positive for wound.     Physical Exam Triage Vital Signs ED Triage Vitals  Encounter Vitals Group     BP 08/06/24 1059 106/70     Girls Systolic BP Percentile --      Girls Diastolic BP Percentile --      Boys Systolic BP Percentile --      Boys Diastolic BP Percentile --      Pulse Rate 08/06/24 1057 63     Resp 08/06/24 1057 17     Temp 08/06/24 1057 (!) 97.4 F (36.3 C)     Temp Source 08/06/24 1057 Oral     SpO2 08/06/24 1057 99 %     Weight --      Height --      Head Circumference --      Peak Flow --      Pain Score 08/06/24 1056 0     Pain Loc --      Pain Education --      Exclude from Growth Chart --    No data found.  Updated Vital Signs BP 106/70   Pulse 63   Temp (!) 97.4 F (36.3 C) (Oral)   Resp 17   SpO2 99%   Visual Acuity Right  Eye Distance:   Left Eye Distance:   Bilateral Distance:    Right Eye Near:   Left Eye Near:    Bilateral Near:     Physical Exam Vitals and nursing note reviewed.  Constitutional:      General: He is not in acute distress.    Appearance: Normal appearance. He is not ill-appearing.  HENT:     Head: Normocephalic and atraumatic.  Eyes:     Pupils: Pupils are equal, round, and reactive to light.  Cardiovascular:     Rate and Rhythm: Normal rate.  Pulmonary:     Effort: Pulmonary effort is normal.  Musculoskeletal:       Hands:     Comments: There is a small puncture wound between the right 4th and 5th fingers with a superficial scratch on the dorsal aspect of the right fourth finger.  No active drainage swelling or erythema.  See photos.  Skin:    General: Skin is warm and dry.  Neurological:     General: No focal deficit present.     Mental Status: He is alert and oriented to person, place, and time.  Psychiatric:        Mood and Affect: Mood normal.        Behavior: Behavior normal.         UC Treatments / Results  Labs (all labs ordered are listed, but only  abnormal results are displayed) Labs Reviewed - No data to display  EKG   Radiology No results found.  Procedures Procedures (including critical care time)  Medications Ordered in UC Medications  Tdap (BOOSTRIX) injection 0.5 mL (has no administration in time range)    Initial Impression / Assessment and Plan / UC Course  I have reviewed the triage vital signs and the nursing notes.  Pertinent labs & imaging results that were available during my care of the patient were reviewed by me and considered in my medical decision making (see chart for details).     Reviewed exam and symptoms with patient.  No red flags.  Wound was cleansed and dressed by nursing staff.  Tetanus was updated in clinic.  Will start Augmentin  twice a day for 5 days for infection prevention.  Wound care reviewed.  Advised PCP follow-up if symptoms do not improve.  ER precautions reviewed. Final Clinical Impressions(s) / UC Diagnoses   Final diagnoses:  Dog bite of finger, initial encounter     Discharge Instructions      Keep the wound clean and dry.  Start Augmentin  twice daily for 5 days to help prevent infection.  Your tetanus was updated in the clinic today and is good for 10 years.  Monitor for any signs of infection which include are not limited to redness, drainage, swelling, warmth, fevers or chills and please seek reevaluation in the emergency room if these occur.  Please follow-up with your PCP if your symptoms do not improve.  Hope you feel better soon!     ED Prescriptions     Medication Sig Dispense Auth. Provider   amoxicillin -clavulanate (AUGMENTIN ) 875-125 MG tablet Take 1 tablet by mouth every 12 (twelve) hours for 5 days. 10 tablet Gurkaran Rahm, Jodi R, NP      PDMP not reviewed this encounter.   Loreda Myla SAUNDERS, NP 08/06/24 1110

## 2024-11-13 ENCOUNTER — Other Ambulatory Visit

## 2024-11-13 DIAGNOSIS — Z111 Encounter for screening for respiratory tuberculosis: Secondary | ICD-10-CM

## 2024-11-15 LAB — QUANTIFERON-TB GOLD PLUS
QuantiFERON Mitogen Value: >10 [IU]/mL
QuantiFERON Nil Value: 0.03 [IU]/mL
QuantiFERON TB1 Ag Value: 0.05 [IU]/mL
QuantiFERON TB2 Ag Value: 0.05 [IU]/mL
QuantiFERON-TB Gold Plus: NEGATIVE

## 2024-11-17 ENCOUNTER — Ambulatory Visit: Payer: Self-pay | Admitting: Internal Medicine

## 2025-07-15 ENCOUNTER — Encounter: Payer: Self-pay | Admitting: Internal Medicine
